# Patient Record
Sex: Male | Born: 1965 | Race: White | Hispanic: No | Marital: Married | Smoking: Never smoker
Health system: Southern US, Community
[De-identification: ages and names within clinical notes are randomized; demographics above are authoritative.]

## PROBLEM LIST (undated history)

## (undated) DIAGNOSIS — I10 Essential (primary) hypertension: Secondary | ICD-10-CM

## (undated) DIAGNOSIS — M542 Cervicalgia: Secondary | ICD-10-CM

## (undated) DIAGNOSIS — R519 Headache, unspecified: Secondary | ICD-10-CM

## (undated) DIAGNOSIS — R51 Headache: Secondary | ICD-10-CM

## (undated) HISTORY — DX: Headache, unspecified: R51.9

## (undated) HISTORY — PX: FOREARM SURGERY: SHX651

## (undated) HISTORY — PX: KNEE SURGERY: SHX244

## (undated) HISTORY — DX: Cervicalgia: M54.2

## (undated) HISTORY — DX: Essential (primary) hypertension: I10

## (undated) HISTORY — DX: Headache: R51

## (undated) HISTORY — PX: VASECTOMY: SHX75

---

## 2003-02-12 ENCOUNTER — Emergency Department (HOSPITAL_COMMUNITY): Admission: EM | Admit: 2003-02-12 | Discharge: 2003-02-12 | Payer: Self-pay | Admitting: Internal Medicine

## 2010-12-12 ENCOUNTER — Encounter: Payer: Self-pay | Admitting: Internal Medicine

## 2010-12-31 DIAGNOSIS — J984 Other disorders of lung: Secondary | ICD-10-CM | POA: Insufficient documentation

## 2011-01-01 ENCOUNTER — Institutional Professional Consult (permissible substitution) (INDEPENDENT_AMBULATORY_CARE_PROVIDER_SITE_OTHER): Payer: BC Managed Care – PPO | Admitting: Internal Medicine

## 2011-01-01 ENCOUNTER — Ambulatory Visit (INDEPENDENT_AMBULATORY_CARE_PROVIDER_SITE_OTHER)
Admission: RE | Admit: 2011-01-01 | Discharge: 2011-01-01 | Disposition: A | Discharge status: Home / Self Care | Payer: BC Managed Care – PPO | Source: Ambulatory Visit | Attending: Internal Medicine | Admitting: Internal Medicine

## 2011-01-01 ENCOUNTER — Other Ambulatory Visit: Payer: Self-pay | Admitting: Internal Medicine

## 2011-01-01 ENCOUNTER — Encounter: Payer: Self-pay | Admitting: Internal Medicine

## 2011-01-01 DIAGNOSIS — J984 Other disorders of lung: Secondary | ICD-10-CM

## 2011-01-01 DIAGNOSIS — I10 Essential (primary) hypertension: Secondary | ICD-10-CM | POA: Insufficient documentation

## 2011-01-02 ENCOUNTER — Encounter: Payer: Self-pay | Admitting: Internal Medicine

## 2011-01-07 ENCOUNTER — Telehealth: Payer: Self-pay | Admitting: Internal Medicine

## 2011-01-08 NOTE — Letter (Signed)
Summary: [facility]  [address]   [place], [place] [address]   Phone: [phone]  Fax: [phone]    01/02/2011  Lawrence Hines [address] [place], [place]  [address]  [place]  Dear Mr. ARNEY,   Please contact our office at [phone] to obatin recent test results. Thank You        Sincerely,   [facility]

## 2011-01-08 NOTE — Assessment & Plan Note (Signed)
Summary: Pulmonary Consultation/ eva RML nodule   Visit Type:  Initial Consult Primary Provider/Referring Provider:  Doreen Beam, MD  CC:  Pulmonary nodule.  History of Present Illness: 34 yowm never smoked but grew up in house full of smoke and now concerned about incidental RML by ct abd incidental finding for eval of flank pain, resolved   January 01, 2011   1st pulmonary office eval ov for eval of 3 mm 12/12/10 no cough or sob. Pt denies any significant sore throat, dysphagia, itching, sneezing,  nasal congestion or excess secretions,  fever, chills, sweats, unintended wt loss, pleuritic or exertional cp, hempoptysis, change in activity tolerance  orthopnea pnd or leg swelling.  No h/o RA or exp to histo  Preventive Screening-Counseling & Management  Alcohol-Tobacco     Passive Smoke Exposure: yes  Current Medications (verified): 1)  Allegra-D Allergy & Congestion 60-120 Mg Xr12h-Tab (Fexofenadine-Pseudoephedrine) .... Take 1 Tablet By Mouth Two Times A Day As Needed 2)  Benicar Hct 20-12.5 Mg Tabs (Olmesartan Medoxomil-Hctz) .... Take 1 Tablet By Mouth Once A Day  Allergies (verified): No Known Drug Allergies  Past History:  Past Medical History: CT ABD 3mm RML nodule 12/12/10  Past Surgical History: Left knee arthroscopy Left arm surgery x 3 Rt knee arthroscopy x 2  Family History: Lung CA- Father (was a smoker)  Social History: Married Children Never smoker Positive history of passive tobacco smoke exposure.  Naval architect Works for [employer] Exposure:  yes  Review of Systems  The patient denies shortness of breath with activity, shortness of breath at rest, productive cough, non-productive cough, coughing up blood, chest pain, irregular heartbeats, acid heartburn, indigestion, loss of appetite, weight change, abdominal pain, difficulty swallowing, sore throat, tooth/dental problems, headaches, nasal congestion/difficulty breathing through nose,  sneezing, itching, ear ache, anxiety, depression, hand/feet swelling, joint stiffness or pain, rash, change in color of mucus, and fever.    Vital Signs:  Patient profile:   45 year old male Height:      70 inches Weight:      221 pounds BMI:     31.82 O2 Sat:      97 % on Room air Temp:     98.0 degrees F oral Pulse rate:   79 / minute BP sitting:   122 / 76  (left arm)  Vitals Entered By: Vernie Murders (January 01, 2011 10:04 AM)  O2 Flow:  Room air  Physical Exam  Additional Exam:  wt 221 January 01, 2011  anxious wm nad HEENT: nl dentition, turbinates, and orophanx. Nl external ear canals without cough reflex NECK :  without JVD/Nodes/TM/ nl carotid upstrokes bilaterally LUNGS: no acc muscle use, clear to A and P bilaterally without cough on insp or exp maneuvers CV:  RRR  no s3 or murmur or increase in P2, no edema  ABD:  soft and nontender with nl excursion in the supine position. No bruits or organomegaly, bowel sounds nl MS:  warm without deformities, calf tenderness, cyanosis or clubbing SKIN: warm and dry without lesions   NEURO:  alert, approp, no deficits     CXR  Procedure date:  01/01/2011  Findings:      Comparison: None.  Findings: Trachea is midline.  Heart size normal.  Lungs are somewhat low in volume but clear.  No pleural fluid.  IMPRESSION: No acute findings.  Impression & Recommendations:  Problem # 1:  LUNG NODULE (ICD-518.89) only 3 mm by ct  and no macroscopic changes on cxr of concern.  He is relatively low risk and at 3 mm the only decision to be made is how long to wait before the next CT.  The only risk factors are passive smoke exp and pos fm hx.  Discussed in detail all the  indications, usual  risks and alternatives  relative to the benefits with patient who agrees to proceed with CT chest at 6 months or 06/12/2011  Other Orders: T-2 View CXR ([id number]) Consultation Level IV ([address]) Consultation Level IV (60454)  Patient  Instructions: 1)  Full CT of Chest to be done in August of 2012 to follow up the lung nodule which is way too small now to attempt a biopsy or sugery.

## 2011-01-15 NOTE — Progress Notes (Signed)
Summary: Test results  Phone Note Call from Patient Call back at [phone]   Caller: Patient Call For: Dr Sherene Sires Reason for Call: Lab or Test Results Summary of Call: Patient wants results of chest x-ray.  At work and may not be able to answer phone.  Has called twice today.  Please call back until patient answers. Initial call taken by: Leonette Monarch,  January 07, 2011 10:40 AM  Follow-up for Phone Call        Pt aware cxr results normal and to continue with recs.Carron Curie CMA  January 07, 2011 10:47 AM

## 2014-12-29 ENCOUNTER — Ambulatory Visit (HOSPITAL_COMMUNITY): Payer: Self-pay | Admitting: Psychology

## 2015-12-19 ENCOUNTER — Encounter: Payer: Self-pay | Admitting: Neurology

## 2015-12-19 ENCOUNTER — Ambulatory Visit (INDEPENDENT_AMBULATORY_CARE_PROVIDER_SITE_OTHER): Payer: BLUE CROSS/BLUE SHIELD | Admitting: Neurology

## 2015-12-19 VITALS — BP 128/78 | HR 88 | Ht 70.0 in | Wt 223.0 lb

## 2015-12-19 DIAGNOSIS — M542 Cervicalgia: Secondary | ICD-10-CM | POA: Diagnosis not present

## 2015-12-19 NOTE — Progress Notes (Signed)
PATIENT: Lawrence Hines DOB: Sep 03, 1966  Chief Complaint  Patient presents with  . Neck Pain    He is here with his wife, Georganna Skeans.  Reports having neck pain radiating into his left shoulder.  He has had a recent abnormal MRI and would like to further review the results.  His pain is causing him to have frequent headaches.  He has just completed a Prednisone dose pack without any improvement in symptoms.  He has also seen a chiropractor and felt it caused the pain to be worse.     HISTORICAL  Lawrence Hines is a 50 years old right-handed male, accompanied by his wife, seen in refer by his primary care physician Dr. 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 In Nov 2016, without clear triggers, he noticed gradual onset left-sided neck pain, radiating to his left shoulder, gradually build up, like a constant toothache, 5 out of 10 at baseline, sometimes it would exacerbated to 8 out of 10, over the past few months, he has tried ibuprofen 800 mg, Flexeril, hydrocodone as needed, with some improvement, he had a history of compounding fracture at his left arm due to baseball injury many years ago, he has occasionally numbness tingling of left lateral shoulder, denies significant weakness, no persistent sensory loss,  He denies right arm involvement, no gait difficulty, no bowel and bladder incontinence,  He also described frequent headaches, starting from neck, spreading to bilateral temporal region, constant pressure, 5/10, he has light noise sensitivity,   he denies nausea, he has high-pitched tinnitus, no hearing loss, no visual loss.   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 amLODipine (NORVASC) 5 MG tablet Take 5 mg by mouth daily.  0  . cyclobenzaprine (FLEXERIL) 10 MG tablet Take 10 mg by mouth 3 (three) times daily as needed.  1  . HYDROcodone-acetaminophen (NORCO) 7.5-325 MG tablet Take 1 tablet by mouth every 6 (six) hours as needed for moderate pain.    Marland Kitchen ibuprofen (ADVIL,MOTRIN) 800 MG tablet TAKE ONE TABLET BY MOUTH EVERY 8 HOURS AS NEEDED FOR NECK PAIN. TAKE WITH FOOD.  1  . olmesartan-hydrochlorothiazide (BENICAR HCT) 20-12.5 MG tablet Take 1 tablet by mouth daily.     No current facility-administered medications for this visit.    PAST MEDICAL HISTORY: Past Medical History  Diagnosis Date  . Hypertension   . Headache   . Neck pain     PAST SURGICAL HISTORY: Past Surgical History  Procedure Laterality Date  . Forearm surgery Left     x 3  . Knee surgery Bilateral     Left x 2, Right x 1  . Vasectomy      FAMILY HISTORY: Family History  Problem Relation Age of Onset  . Lung cancer Father   . Hypertension Father   .  Hyperlipidemia Father   . Diabetes Father   . Healthy Mother     SOCIAL HISTORY:  Social History   Social History  . Marital Status: Married    Spouse Name: N/A  . Number of Children: 1  . Years of Education: 13   Occupational History  . Manufacturer    Social History Main Topics  . Smoking status: Never Smoker   . Smokeless tobacco: Current User    Types: Chew     Comment: 1 can of chew per week  . Alcohol Use: 0.0 oz/week    0 Standard drinks or equivalent per week     Comment: Social use only    . Drug Use: No  . Sexual Activity: Not on file   Other Topics Concern  . Not on file   Social History Narrative   Lives at home with wife and son.   Right-handed.   No caffeine use.     PHYSICAL EXAM   Filed Vitals:   12/19/15 1121  BP: 128/78  Pulse: 88  Height:  (1.778 m)  Weight: 223 lb (101.152 kg)    Not recorded      Body mass index is 32 kg/(m^2).  PHYSICAL EXAMNIATION:  Gen: NAD, conversant, well nourised, obese, well groomed                     Cardiovascular: Regular rate rhythm, no peripheral edema, warm, nontender. Eyes: Conjunctivae clear without exudates or hemorrhage Neck: Supple, no carotid bruise. Pulmonary: Clear to auscultation bilaterally  Musculoskeletal: Tenderness of left upper trapezius, bilateral nuchal area, left worse than right, upon deep palpitation   NEUROLOGICAL EXAM:  MENTAL STATUS: Speech:    Speech is normal; fluent and spontaneous with normal comprehension.  Cognition:     Orientation to time, place and person     Normal recent and remote memory     Normal Attention span and concentration     Normal Language, naming, repeating,spontaneous speech     Fund of knowledge   CRANIAL NERVES: CN II: Visual fields are full to confrontation. Fundoscopic exam is normal with sharp discs and no vascular changes. Pupils are round equal and briskly reactive to light. CN III, IV, VI: extraocular movement are normal. No ptosis. CN V: Facial sensation is intact to pinprick in all 3 divisions bilaterally. Corneal responses are intact.  CN VII: Face is symmetric with normal eye closure and smile. CN VIII: Hearing is normal to rubbing fingers CN IX, X: Palate elevates symmetrically. Phonation is normal. CN XI: Head turning and shoulder shrug are intact CN XII: Tongue is midline with normal movements and no atrophy.  MOTOR: There is no pronator drift of out-stretched arms. Muscle bulk and tone are normal. Muscle strength is  normal.Well-healed left arm compounding fracture  REFLEXES: Reflexes are  3  and symmetric at the biceps, triceps, knees, and ankles. Plantar responses are flexor.  SENSORY: Intact to light touch, pinprick, position sense, and vibration sense are intact in fingers and toes.  COORDINATION: Rapid alternating movements and fine finger movements are intact. There is no dysmetria on finger-to-nose and heel-knee-shin.    GAIT/STANCE: Posture is normal. Gait is steady with normal steps, base, arm swing, and turning. Heel and toe walking are normal. Tandem gait is normal.  Romberg is absent.   DIAGNOSTIC DATA (LABS, IMAGING, TESTING) - I reviewed patient records, labs, notes, testing and imaging myself where available.   ASSESSMENT AND PLAN  Reynolds Kittel  Mangel is a 50 y.o. male   Left-sided neck pain, radiating pain to left shoulder, tenderness upon deep palpitation at left upper trapezia, bilateral nuchal muscles, left worse than right, normal neurological examination, well preserved deep tendon reflexes  Most consistent with musculoskeletal etiology  I have suggested neck stretching exercise, heating pad, continue when necessary NSAIDs  May consider physical therapy, chiropractor     Levert Feinstein, M.D. Ph.D.  [facility] [address], [address] [place], [place] [address] Ph: [phone] Fax: [phone]  ON:GEXBM Armanda Heritage, MD

## 2016-01-10 ENCOUNTER — Ambulatory Visit: Payer: BLUE CROSS/BLUE SHIELD | Admitting: Neurology

## 2019-07-22 ENCOUNTER — Other Ambulatory Visit: Payer: Self-pay

## 2019-07-22 DIAGNOSIS — Z20822 Contact with and (suspected) exposure to covid-19: Secondary | ICD-10-CM

## 2019-07-23 LAB — NOVEL CORONAVIRUS, NAA: SARS-CoV-2, NAA: DETECTED — AB

## 2021-03-20 ENCOUNTER — Encounter (INDEPENDENT_AMBULATORY_CARE_PROVIDER_SITE_OTHER): Payer: Self-pay | Admitting: *Deleted

## 2021-04-23 ENCOUNTER — Ambulatory Visit (INDEPENDENT_AMBULATORY_CARE_PROVIDER_SITE_OTHER): Payer: BC Managed Care – PPO | Admitting: Gastroenterology

## 2021-04-23 ENCOUNTER — Encounter (INDEPENDENT_AMBULATORY_CARE_PROVIDER_SITE_OTHER): Payer: Self-pay | Admitting: Gastroenterology

## 2021-04-23 ENCOUNTER — Other Ambulatory Visit: Payer: Self-pay

## 2021-04-23 DIAGNOSIS — K602 Anal fissure, unspecified: Secondary | ICD-10-CM

## 2021-04-23 DIAGNOSIS — K644 Residual hemorrhoidal skin tags: Secondary | ICD-10-CM | POA: Diagnosis not present

## 2021-04-23 MED ORDER — CLOTRIMAZOLE 1 % EX CREA: 1.0000 "application " | TOPICAL_CREAM | Freq: Two times a day (BID) | CUTANEOUS | 0 refills | Status: DC

## 2021-04-23 MED ORDER — DILTIAZEM GEL 2 %: 1.0000 "application " | Freq: Two times a day (BID) | CUTANEOUS | 2 refills | Status: DC

## 2021-04-23 MED ORDER — LIDOCAINE 2 % EX GEL: 1.0000 "application " | CUTANEOUS | 1 refills | Status: DC | PRN

## 2021-04-23 NOTE — Progress Notes (Signed)
Lawrence Hines, M.D. Gastroenterology & Hepatology [facility] [address] [place], [place] [address] Primary Care Physician: Ignatius Specking, MD [address] [place] [address]  Referring MD: PCP  Chief Complaint: Severe rectal pain  History of Present Illness: Lawrence Hines is a 55 y.o. male with past medical history of hypertension and hemorrhoids status post hemorrhoidectomy who presents for evaluation of severe rectal pain.  The patient reports that he has presented recurrent episodes of pain in the anal area since January 2021. He states that he never had pain in this area in the past.  Patient states that he had frequent episodes of rectal pain, for which he sought help with [facility]  He was evaluated by Dr.  Marcha Solders who performed a colonoscopy with hemorrhoid banding x4 in February 2021.  He states that at that time he had some rectal bleeding which slightly improved with the banding but he was having persistent pain.  Due to this he underwent a subsequent hemorrhoidectomy for grade 2 hemorrhoids on 05/2020.  The patient states that after having the surgery the pain became more intense and frequent.  In fact he has been affecting his work and he has not been able to sit for prolonged periods of time.  Has used preparation H in the past a few times without significant relief.   He reports having persistent pain in the rectal area but he has noticed that when he sits the pain is worst. He feels that the pain "is in the left side of the rectum". He reports that he has worsening pain after he has a bowel movement.He denies having any melena or hematochezia. He is currently taking docusate on Mondays and Fridays.  He has at least 1 soft BM every day. He is currently doing sitz baths x2 per day.  The patient denies having any nausea, vomiting, fever, chills, hematochezia, melena, hematemesis, abdominal distention, abdominal pain,  diarrhea, jaundice, pruritus or weight loss.  Last Colonoscopy:11/2019 - tubular adenoma removed from [facility] (unknown size) and hemorrhoid banding. Advised to repeat in 5 years.  FHx: neg for any gastrointestinal/liver disease, no malignancies Social: neg smoking, alcohol or illicit drug use Surgical: no abdominal surgeries  Past Medical History: Past Medical History:  Diagnosis Date   Headache    Hypertension    Neck pain     Past Surgical History: Past Surgical History:  Procedure Laterality Date   FOREARM SURGERY Left    x 3   KNEE SURGERY Bilateral    Left x 2, Right x 1   VASECTOMY      Family History: Family History  Problem Relation Age of Onset   Lung cancer Father    Hypertension Father    Hyperlipidemia Father    Diabetes Father    Healthy Mother     Social History: Social History   Tobacco Use  Smoking Status Never  Smokeless Tobacco Current   Types: Chew  Tobacco Comments   1 can of chew per week   Social History   Substance and Sexual Activity  Alcohol Use Yes   Alcohol/week: 0.0 standard drinks   Comment: Social use only   Social History   Substance and Sexual Activity  Drug Use No    Allergies: No Known Allergies  Medications: Current Outpatient Medications  Medication Sig Dispense Refill   amLODipine (NORVASC) 5 MG tablet Take 5 mg by mouth daily.  0   clonazePAM (KLONOPIN) 0.5 MG  tablet Take 0.5 mg by mouth at bedtime. As needed.     docusate sodium (COLACE) 100 MG capsule Take 100 mg by mouth daily. On Mondays and Fridays per patient.     hydrochlorothiazide (HYDRODIURIL) 12.5 MG tablet Take 12.5 mg by mouth daily.     olmesartan (BENICAR) 40 MG tablet Take 40 mg by mouth daily.     tadalafil (CIALIS) 5 MG tablet Take 5 mg by mouth daily as needed for erectile dysfunction.     No current facility-administered medications for this visit.    Review of Systems: GENERAL: negative for malaise, night sweats HEENT: No changes in  hearing or vision, no nose bleeds or other nasal problems. NECK: Negative for lumps, goiter, pain and significant neck swelling RESPIRATORY: Negative for cough, wheezing CARDIOVASCULAR: Negative for chest pain, leg swelling, palpitations, orthopnea GI: SEE HPI MUSCULOSKELETAL: Negative for joint pain or swelling, back pain, and muscle pain. SKIN: Negative for lesions, rash PSYCH: Negative for sleep disturbance, mood disorder and recent psychosocial stressors. HEMATOLOGY Negative for prolonged bleeding, bruising easily, and swollen nodes. ENDOCRINE: Negative for cold or heat intolerance, polyuria, polydipsia and goiter. NEURO: negative for tremor, gait imbalance, syncope and seizures. The remainder of the review of systems is noncontributory.   Physical Exam: BP (!) 133/97 (BP Location: Right Arm, Patient Position: Sitting, Cuff Size: Large)   Pulse 64   Temp 98.2 F (36.8 C) (Oral)   Ht 5\' 10"  (1.778 m)   Wt 228 lb (103.4 kg)   BMI 32.71 kg/m  GENERAL: The patient is AO x3, in no acute distress. HEENT: Head is normocephalic and atraumatic. EOMI are intact. Mouth is well hydrated and without lesions. NECK: Supple. No masses LUNGS: Clear to auscultation. No presence of rhonchi/wheezing/rales. Adequate chest expansion HEART: RRR, normal s1 and s2. ABDOMEN: Soft, nontender, no guarding, no peritoneal signs, and nondistended. BS +. No masses. RECTAL EXAM: there is presence of a 1 cm anal fissure at  6 position -very tender to gentle touch, with presence of grade 1 non thrombosed external hemorrhoids, no presence of internal masses upon DRE, normal anal tone. Chaperone: Lovelace EXTREMITIES: Without any cyanosis, clubbing, rash, lesions or edema. NEUROLOGIC: AOx3, no focal motor deficit. SKIN: no jaundice, no rashes   Imaging/Labs: as above  I personally reviewed and interpreted the available labs, imaging and endoscopic files.  Impression and Plan: TAEVEON KEESLING is a  55 y.o. male with past medical history of hypertension and hemorrhoids status post hemorrhoidectomy who presents for evaluation of severe rectal pain.  Had history of hemorrhoids that have been treated with banding and hemorrhoidectomy without significant relief of his pain.  The patient has areas of an anal fissure which could cause the severe pain he is currently presenting.  He has some associated external hemorrhoids but these are not indurated or thrombosed.  For now, we will focus on management of the anal fissure with a combination of topical antimycotic and diltiazem for 6 to 8 weeks.  To alleviate his symptoms he can use some lidocaine gel and sitz baths in between.  I also advised him that he has to take MiraLAX to soften his bowel movements even more to avoid any significant straining.  Patient understood and agreed.  - Start diltiazem cream in anal area - use twice a day - Start clotrimazole cream in anal area - Can use topical lidocaine to numb anal area - Can do sitz baths to improve abdominal pain - Start taking Miralax  1 capful every day tow soften stool - RTC 2 months  All questions were answered.      Lawrence Blazing, MD Gastroenterology and Hepatology [facility]

## 2021-04-23 NOTE — Patient Instructions (Signed)
Start diltiazem cream in anal area - use twice a day Start clotrimazole cream in anal area Can use topical lidocaine to numb anal area Can do sitz baths to improve abdominal pain Start taking Miralax 1 capful every day tow soften stool

## 2021-06-26 ENCOUNTER — Other Ambulatory Visit (INDEPENDENT_AMBULATORY_CARE_PROVIDER_SITE_OTHER): Payer: Self-pay | Admitting: Gastroenterology

## 2021-06-26 ENCOUNTER — Ambulatory Visit (INDEPENDENT_AMBULATORY_CARE_PROVIDER_SITE_OTHER): Payer: BC Managed Care – PPO | Admitting: Gastroenterology

## 2021-06-26 ENCOUNTER — Other Ambulatory Visit: Payer: Self-pay

## 2021-06-26 ENCOUNTER — Encounter (INDEPENDENT_AMBULATORY_CARE_PROVIDER_SITE_OTHER): Payer: Self-pay | Admitting: Gastroenterology

## 2021-06-26 VITALS — BP 137/83 | HR 73 | Temp 98.2°F | Ht 70.0 in | Wt 226.2 lb

## 2021-06-26 DIAGNOSIS — K6289 Other specified diseases of anus and rectum: Secondary | ICD-10-CM | POA: Insufficient documentation

## 2021-06-26 DIAGNOSIS — K602 Anal fissure, unspecified: Secondary | ICD-10-CM

## 2021-06-26 MED ORDER — DILTIAZEM GEL 2 %: 1.0000 "application " | Freq: Two times a day (BID) | CUTANEOUS | 2 refills | Status: DC

## 2021-06-26 MED ORDER — CLOTRIMAZOLE 1 % EX CREA: 1.0000 "application " | TOPICAL_CREAM | Freq: Two times a day (BID) | CUTANEOUS | 0 refills | Status: DC

## 2021-06-26 MED ORDER — LIDOCAINE 2 % EX GEL: 1.0000 "application " | CUTANEOUS | 1 refills | Status: AC | PRN

## 2021-06-26 NOTE — Patient Instructions (Addendum)
Continue using diltiazem cream, clotrimazole and lidocaine to help with rectal pain. We have refilled all of these for you Continue miralax and high fiber diet to keep stools very soft/loose. We will send referral to surgeon for further evaluation of your hemorrhoids.

## 2021-06-26 NOTE — Progress Notes (Signed)
Referring Provider: Ignatius Specking, MD Primary Care Physician:  Ignatius Specking, MD Primary GI Physician: Levon Hedger  Chief Complaint  Patient presents with   Follow-up    Patient states he is no better. He has some issues with hemorrhoids, he says the are very painful. This is interfering with his job. He has not seen any blood in stools. He has soft stools he eats high fiber diet, drinks plenty of water. Uses a sitz bath daily has to sit on a ice bottle for relief at work. He has a good appetite.    HPI:   Lawrence Hines is a 55 y.o. male with past medical history of hypertension, hemorrhoids s/p hemorrhoidectomy in August 2021. Last seen in clinic in June 2022 for severe rectal pain.   Patient presenting today for follow up of rectal pain, states it has been ongoing since hemorrhoidectomy at the end of last year.  Source of rectal pain at last clinic visit in June 2022 was suspected secondary to anal fissure as hemorrhoids present were small and not thrombosed. He was prescribed diltiazem gel and clotrimazole cream as well as topical lidocaine with instructions to do sitz bath and 1 capful of miralax daily to help with pain. He reports he has been using all prescribed medications for anal fissure as well as taking miralax daily, eating diet high in fiber, and doing multiple sitz baths per day without any relief. He denies any bleeding or itching and describes pain as severe burning, especially worse after BMs. Reports atleast one good BM per day that is very soft/loose. He is having to sit on a water bottle at work and has also had to miss a few days of work due to the pain he is experiencing.   Last Colonoscopy:(11/2019) tubular adenoma removed from [facility] (size unknown) Last Endoscopy:  Recommendations:  Repeat surveillance colonoscopy in 2026  Past Medical History:  Diagnosis Date   Headache    Hypertension    Neck pain     Past Surgical History:  Procedure Laterality Date   FOREARM  SURGERY Left    x 3   KNEE SURGERY Bilateral    Left x 2, Right x 1   VASECTOMY      Current Outpatient Medications  Medication Sig Dispense Refill   amLODipine (NORVASC) 5 MG tablet Take 5 mg by mouth daily.  0   clonazePAM (KLONOPIN) 0.5 MG tablet Take 0.5 mg by mouth at bedtime. As needed.     clotrimazole (LOTRIMIN) 1 % cream Apply 1 application topically 2 (two) times daily. 30 g 0   hydrochlorothiazide (HYDRODIURIL) 12.5 MG tablet Take 12.5 mg by mouth daily.     Lidocaine 2 % GEL Apply 1 application topically as needed. Apply in anal area 28.33 g 1   olmesartan (BENICAR) 40 MG tablet Take 40 mg by mouth daily.     polyethylene glycol (MIRALAX / GLYCOLAX) 17 g packet Take 17 g by mouth daily.     tadalafil (CIALIS) 5 MG tablet Take 5 mg by mouth daily as needed for erectile dysfunction.     diltiazem 2 % GEL Apply 1 application topically 2 (two) times daily. (Patient not taking: Reported on 06/26/2021) 30 g 2   docusate sodium (COLACE) 100 MG capsule Take 100 mg by mouth daily. On Mondays and Fridays per patient. (Patient not taking: Reported on 06/26/2021)     No current facility-administered medications for this visit.    Allergies as of 06/26/2021   (  No Known Allergies)    Family History  Problem Relation Age of Onset   Lung cancer Father    Hypertension Father    Hyperlipidemia Father    Diabetes Father    Healthy Mother     Social History   Socioeconomic History   Marital status: Married    Spouse name: Not on file   Number of children: 1   Years of education: 13   Highest education level: Not on file  Occupational History   Occupation: Child psychotherapist  Tobacco Use   Smoking status: Never   Smokeless tobacco: Current    Types: Chew   Tobacco comments:    1 can of chew per week  Vaping Use   Vaping Use: Never used  Substance and Sexual Activity   Alcohol use: Yes    Alcohol/week: 0.0 standard drinks    Comment: Social use only   Drug use: No   Sexual  activity: Not on file  Other Topics Concern   Not on file  Social History Narrative   Lives at home with wife and son.   Right-handed.   No caffeine use.   Social Determinants of Health   Financial Resource Strain: Not on file  Food Insecurity: Not on file  Transportation Needs: Not on file  Physical Activity: Not on file  Stress: Not on file  Social Connections: Not on file    Review of Systems: Gen: Denies fever, chills, anorexia. Denies fatigue, weakness, weight loss.  CV: Denies chest pain, palpitations, syncope, peripheral edema, and claudication. Resp: Denies dyspnea at rest, cough, wheezing, coughing up blood, and pleurisy. GI: Denies vomiting blood, jaundice, and fecal incontinence. Denies dysphagia or odynophagia. +rectal pain +external hemorrhoid Derm: Denies rash, itching, dry skin Psych: Denies depression, anxiety, memory loss, confusion. No homicidal or suicidal ideation.  Heme: Denies bruising, bleeding, and enlarged lymph nodes.  Physical Exam: BP 137/83 (BP Location: Left Arm, Patient Position: Sitting, Cuff Size: Large)   Pulse 73   Temp 98.2 F (36.8 C) (Oral)   Ht 5\' 10"  (1.778 m)   Wt 226 lb 3.2 oz (102.6 kg)   BMI 32.46 kg/m  General:   Alert and oriented. No distress noted. Pleasant and cooperative.  Head:  Normocephalic and atraumatic. Eyes:  Conjuctiva clear without scleral icterus. Mouth:  Oral mucosa pink and moist. Good dentition. No lesions. Heart: Normal rate and rhythm, s1 and s2 heart sounds present.  Lungs: Clear lung sounds in all lobes. Respirations equal and unlabored. Abdomen:  +BS, soft, non-tender and non-distended. No rebound or guarding. No HSM or masses noted. Rectal: large external hemorrhoid, left lateral, small internal hemorrhoid noted at right posterior, very mild fissure, sphincter tone WNL *Dr. present as witness for rectal exam  Derm: No palmar erythema or jaundice Msk:  Symmetrical without gross deformities.  Normal posture. Extremities:  Without edema. Neurologic:  Alert and  oriented x4 Psych:  Alert and cooperative. Normal mood and affect.  Invalid input(s): 6 MONTHS   ASSESSMENT: Lawrence Hines is a 55 y.o. male presenting today with ongoing rectal pain related to hemorrhoids/anal fissure.  Patient has followed recommendations from previous clinic visit in June very closely, using diltiazem gel, clotrimazole cream and lidocaine gel, as well as doing miralax and high fiber diet to ensure very soft/loose stools, however, he has had no improvement in his rectal pain. Pain is so bad it is affect him be able to work, as he has to sit on an ice bottle  all day to get any relief. Taking multiple sitz baths whenever able to help with pain. Denies itching or bleeding.   We will refer him to [facility] for further evaluation as he has a large external hemorrhoid and we have exhausted medicinal treatments. very small anal fissure that looks somewhat healed from previous clinic visit. Patient should continue current medication regimen as well as miralax and high fiber diet to keep stools very soft/loose.   *Dr. Levon Hedger present in room as witness for rectal exam  PLAN:  Continue diltiazem gel BID, clotramizole cream BID and lidocaine gel prn to help with rectal pain 2. Continue miralax and high fiber diet to keep stools very soft/loose 3. Continue sitz baths  4. Referral to [facility] for further eval of hemorrhoids  Follow Up: As needed  Peg Fifer L. Jeanmarie Hubert, MSN, APRN, AGNP-C Adult-Gerontology Nurse Practitioner [facility]

## 2021-07-03 ENCOUNTER — Ambulatory Visit (INDEPENDENT_AMBULATORY_CARE_PROVIDER_SITE_OTHER): Payer: BC Managed Care – PPO | Admitting: Gastroenterology

## 2021-07-30 ENCOUNTER — Ambulatory Visit (INDEPENDENT_AMBULATORY_CARE_PROVIDER_SITE_OTHER): Payer: Self-pay | Admitting: Gastroenterology

## 2022-01-06 NOTE — Progress Notes (Incomplete)
History of Present Illness: ***  Past Medical History:  Diagnosis Date   Headache    Hypertension    Neck pain     Past Surgical History:  Procedure Laterality Date   FOREARM SURGERY Left    x 3   KNEE SURGERY Bilateral    Left x 2, Right x 1   VASECTOMY      Home Medications:  Allergies as of 01/08/2022   No Known Allergies      Medication List        Accurate as of January 06, 2022  8:35 PM. If you have any questions, ask your nurse or doctor.          amLODipine 5 MG tablet Commonly known as: NORVASC Take 5 mg by mouth daily.   clonazePAM 0.5 MG tablet Commonly known as: KLONOPIN Take 0.5 mg by mouth at bedtime. As needed.   clotrimazole 1 % cream Commonly known as: LOTRIMIN Apply 1 application topically 2 (two) times daily.   diltiazem 2 % Gel Apply 1 application topically 2 (two) times daily.   hydrochlorothiazide 12.5 MG tablet Commonly known as: HYDRODIURIL Take 12.5 mg by mouth daily.   Lidocaine 2 % Gel Apply 1 application topically as needed. Apply in anal area   olmesartan 40 MG tablet Commonly known as: BENICAR Take 40 mg by mouth daily.   polyethylene glycol 17 g packet Commonly known as: MIRALAX / GLYCOLAX Take 17 g by mouth daily.   tadalafil 5 MG tablet Commonly known as: CIALIS Take 5 mg by mouth daily as needed for erectile dysfunction.        Allergies: No Known Allergies  Family History  Problem Relation Age of Onset   Lung cancer Father    Hypertension Father    Hyperlipidemia Father    Diabetes Father    Healthy Mother     Social History:  reports that he has never smoked. His smokeless tobacco use includes chew. He reports current alcohol use. He reports that he does not use drugs.  ROS: A complete review of systems was performed.  All systems are negative except for pertinent findings as noted.  Physical Exam:  Vital signs in last 24 hours: There were no vitals taken for this visit. Constitutional:  Alert  and oriented, No acute distress Cardiovascular: Regular rate  Respiratory: Normal respiratory effort GI: Abdomen is soft, nontender, nondistended, no abdominal masses. No CVAT.  Genitourinary: Normal male phallus, testes are descended bilaterally and non-tender and without masses, scrotum is normal in appearance without lesions or masses, perineum is normal on inspection. Lymphatic: No lymphadenopathy Neurologic: Grossly intact, no focal deficits Psychiatric: Normal mood and affect  I have reviewed prior pt notes  I have reviewed notes from referring/previous physicians  I have reviewed urinalysis results  I have independently reviewed prior imaging  I have reviewed prior PSA results  I have reviewed prior urine culture   Impression/Assessment:  ***  Plan:  ***

## 2022-01-08 ENCOUNTER — Encounter: Payer: Self-pay | Admitting: Urology

## 2022-01-08 ENCOUNTER — Other Ambulatory Visit: Payer: Self-pay

## 2022-01-08 ENCOUNTER — Ambulatory Visit: Payer: BC Managed Care – PPO | Admitting: Urology

## 2022-01-08 VITALS — BP 148/104 | HR 62 | Wt 216.0 lb

## 2022-01-08 DIAGNOSIS — N5089 Other specified disorders of the male genital organs: Secondary | ICD-10-CM

## 2022-01-08 LAB — MICROSCOPIC EXAMINATION
Bacteria, UA: NONE SEEN
Renal Epithel, UA: NONE SEEN /hpf
WBC, UA: NONE SEEN /hpf (ref 0–5)

## 2022-01-08 LAB — URINALYSIS, ROUTINE W REFLEX MICROSCOPIC
Bilirubin, UA: NEGATIVE
Glucose, UA: NEGATIVE
Ketones, UA: NEGATIVE
Leukocytes,UA: NEGATIVE
Nitrite, UA: NEGATIVE
Protein,UA: NEGATIVE
Specific Gravity, UA: 1.015 (ref 1.005–1.030)
Urobilinogen, Ur: 0.2 mg/dL (ref 0.2–1.0)
pH, UA: 6 (ref 5.0–7.5)

## 2022-01-29 ENCOUNTER — Ambulatory Visit: Payer: BC Managed Care – PPO | Admitting: Urology

## 2022-02-04 ENCOUNTER — Ambulatory Visit (HOSPITAL_COMMUNITY)
Admission: RE | Admit: 2022-02-04 | Discharge: 2022-02-04 | Disposition: A | Discharge status: Home / Self Care | Payer: BC Managed Care – PPO | Source: Ambulatory Visit | Attending: Urology | Admitting: Urology

## 2022-02-04 DIAGNOSIS — N5089 Other specified disorders of the male genital organs: Secondary | ICD-10-CM | POA: Insufficient documentation

## 2022-02-05 ENCOUNTER — Ambulatory Visit: Payer: BC Managed Care – PPO | Admitting: Urology

## 2022-02-18 NOTE — Progress Notes (Incomplete)
History of Present Illness:  ? ?3.14.2023: 56 year old male comes in today with his wife, Luanne for evaluation and management of right scrotal abnormality. ? ?About 10 days ago began having pain in his right testicle/scrotal area.  Fairly sudden on onset.  Worse with walking and touching the area.  No specific complaints of a lump in his scrotum, but he does have a tight scrotum necessitating a prior anesthetic vasa ectomy years ago. ? ?He presented to his primary care provider in [place] where he was put on Cipro and sent for scrotal ultrasound.  This revealed an extra prostatic abnormality associated with his right epididymis.  He also has a left hydrocele. ? ?He has gotten some better with the antibiotic management.  He still has 2 to 3 days of the Cipro left.  He denies any dysuria, gross hematuria.  He denies any abdominal pain.  He denies any history of scrotal trauma. ? ?Past Medical History:  ?Diagnosis Date  ? Headache   ? Hypertension   ? Neck pain   ? ? ?Past Surgical History:  ?Procedure Laterality Date  ? FOREARM SURGERY Left   ? x 3  ? KNEE SURGERY Bilateral   ? Left x 2, Right x 1  ? VASECTOMY    ? ? ?Home Medications:  ?Allergies as of 02/19/2022   ?No Known Allergies ?  ? ?  ?Medication List  ?  ? ?  ? Accurate as of February 18, 2022  8:40 PM. If you have any questions, ask your nurse or doctor.  ?  ?  ? ?  ? ?amLODipine 5 MG tablet ?Commonly known as: NORVASC ?Take 5 mg by mouth daily. ?  ?ciprofloxacin 500 MG tablet ?Commonly known as: CIPRO ?Take 500 mg by mouth 2 (two) times daily. ?  ?clonazePAM 0.5 MG tablet ?Commonly known as: KLONOPIN ?Take 0.5 mg by mouth at bedtime. As needed. ?  ?diltiazem 2 % Gel ?Apply 1 application topically 2 (two) times daily. ?  ?hydrochlorothiazide 12.5 MG tablet ?Commonly known as: HYDRODIURIL ?Take 12.5 mg by mouth daily. ?  ?Lidocaine 2 % Gel ?Apply 1 application topically as needed. Apply in anal area ?  ?olmesartan 40 MG tablet ?Commonly known as: BENICAR ?Take 40  mg by mouth daily. ?  ?tadalafil 5 MG tablet ?Commonly known as: CIALIS ?Take 5 mg by mouth daily as needed for erectile dysfunction. ?  ? ?  ? ? ?Allergies: No Known Allergies ? ?Family History  ?Problem Relation Age of Onset  ? Lung cancer Father   ? Hypertension Father   ? Hyperlipidemia Father   ? Diabetes Father   ? Healthy Mother   ? ? ?Social History:  reports that he has never smoked. His smokeless tobacco use includes chew. He reports current alcohol use. He reports that he does not use drugs. ? ?ROS: ?A complete review of systems was performed.  All systems are negative except for pertinent findings as noted. ? ?Physical Exam:  ?Vital signs in last 24 hours: ?There were no vitals taken for this visit. ?Constitutional:  Alert and oriented, No acute distress ?Cardiovascular: Regular rate  ?Respiratory: Normal respiratory effort ?GI: Abdomen is soft, nontender, nondistended, no abdominal masses. No CVAT.  ?Genitourinary: Normal male phallus, testes are descended bilaterally and non-tender and without masses, scrotum is normal in appearance without lesions or masses, perineum is normal on inspection. ?Lymphatic: No lymphadenopathy ?Neurologic: Grossly intact, no focal deficits ?Psychiatric: Normal mood and affect ? ?I have reviewed prior pt  notes ? ?I have reviewed notes from referring/previous physicians ? ?I have reviewed urinalysis results ? ?I have independently reviewed prior imaging ? ?I have reviewed prior PSA results ? ?I have reviewed prior urine culture ? ? ?Impression/Assessment:  ?*** ? ?Plan:  ?*** ? ?

## 2022-02-19 ENCOUNTER — Ambulatory Visit: Payer: BC Managed Care – PPO | Admitting: Urology

## 2022-02-19 DIAGNOSIS — N5089 Other specified disorders of the male genital organs: Secondary | ICD-10-CM

## 2023-02-22 IMAGING — US US SCROTUM W/ DOPPLER COMPLETE
1 series · 13 of 25 positions shown · non-contrast
Comparison: 01/03/2022

CLINICAL DATA: Scrotal mass, pain

EXAM:
SCROTAL ULTRASOUND
DOPPLER ULTRASOUND OF THE TESTICLES
TECHNIQUE: Complete ultrasound examination of the testicles, epididymis, and
other scrotal structures was performed. Color and spectral Doppler
ultrasound were also utilized to evaluate blood flow to the
testicles.

[Series 1: us scrotum w/doppler · 13 of 83 slices shown]
[im 1/83]
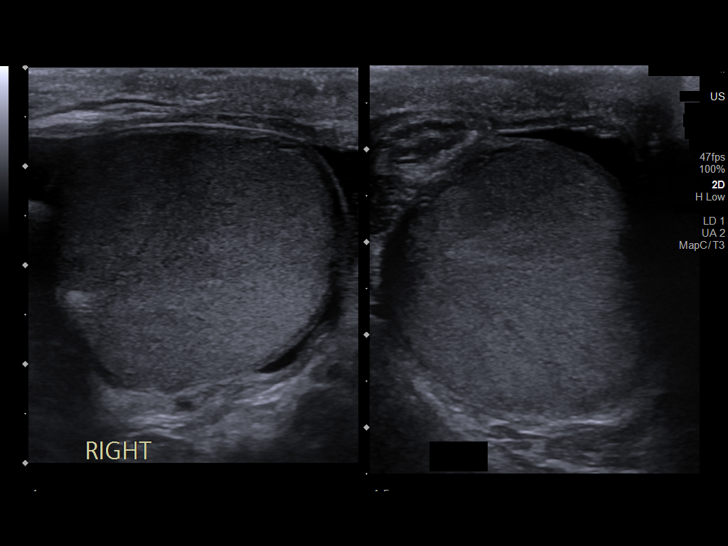
[im 7/83]
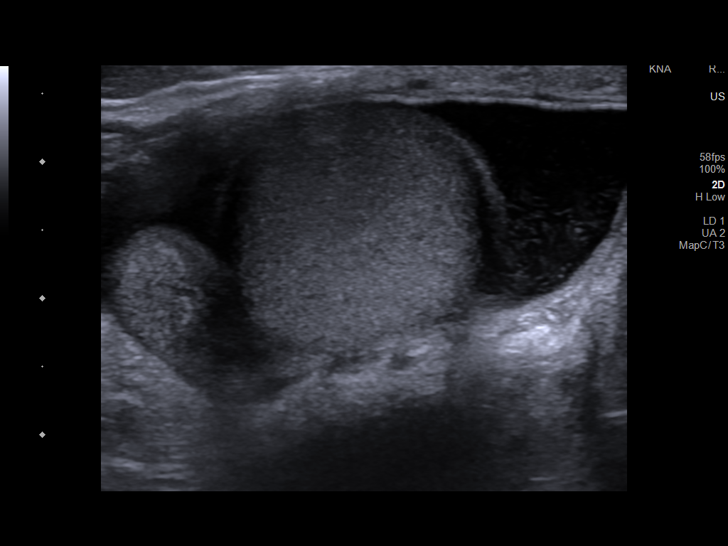
[im 14/83]
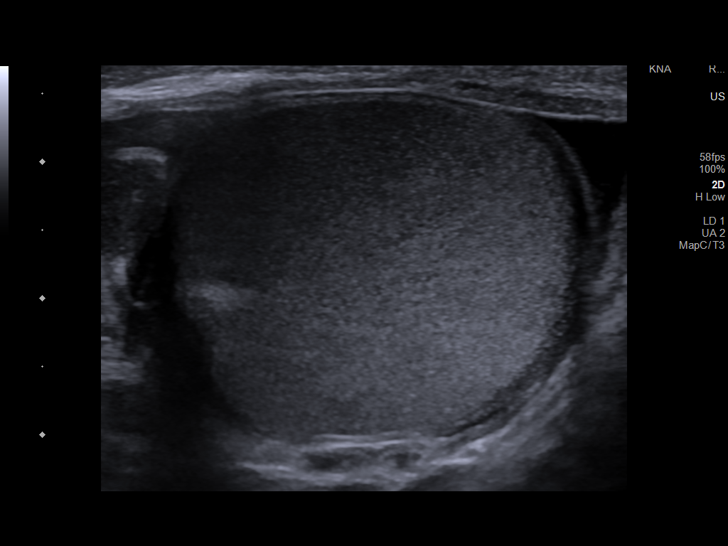
[im 21/83]
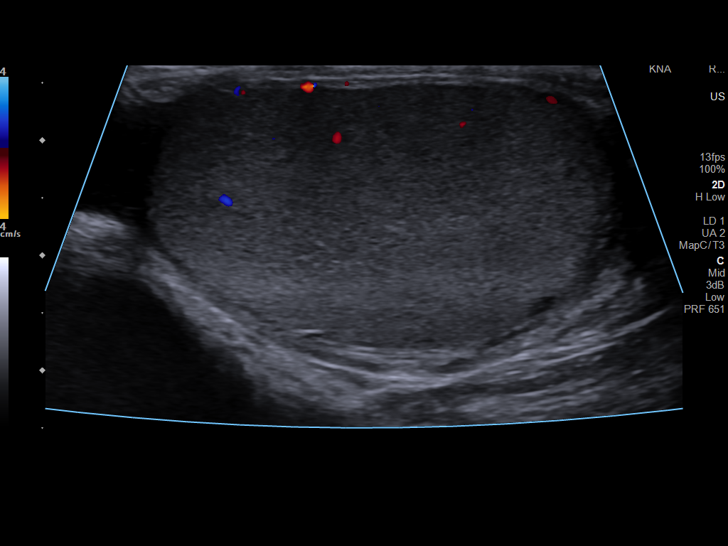
[im 28/83]
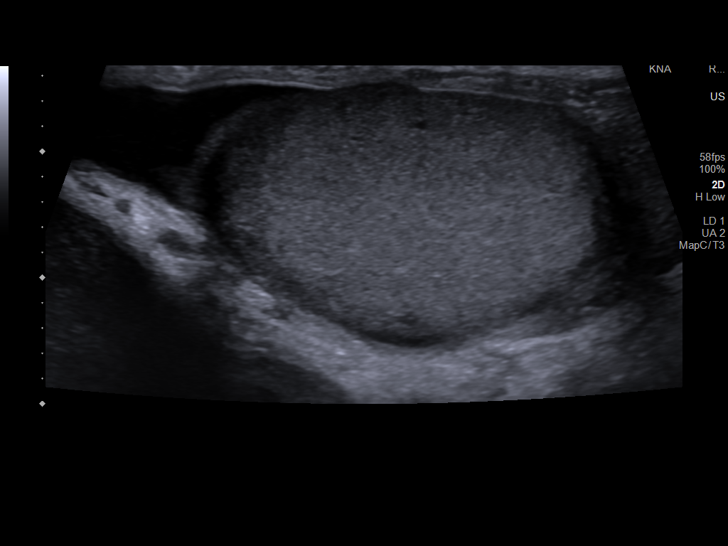
[im 35/83]
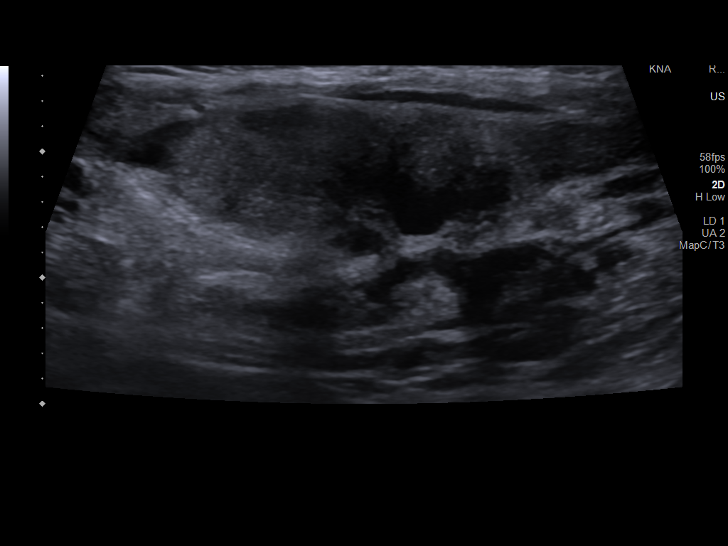
[im 42/83]
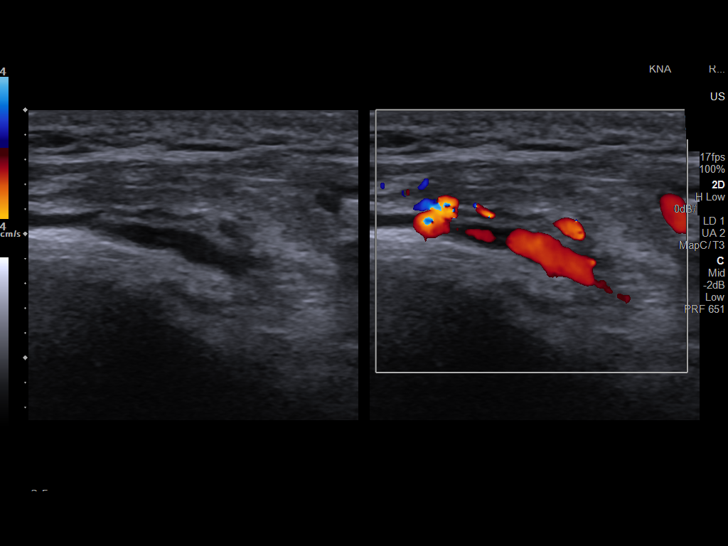
[im 48/83]
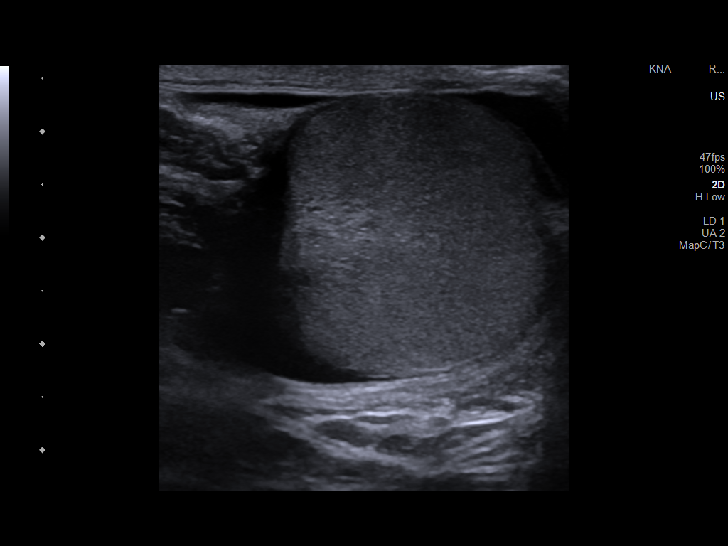
[im 55/83]
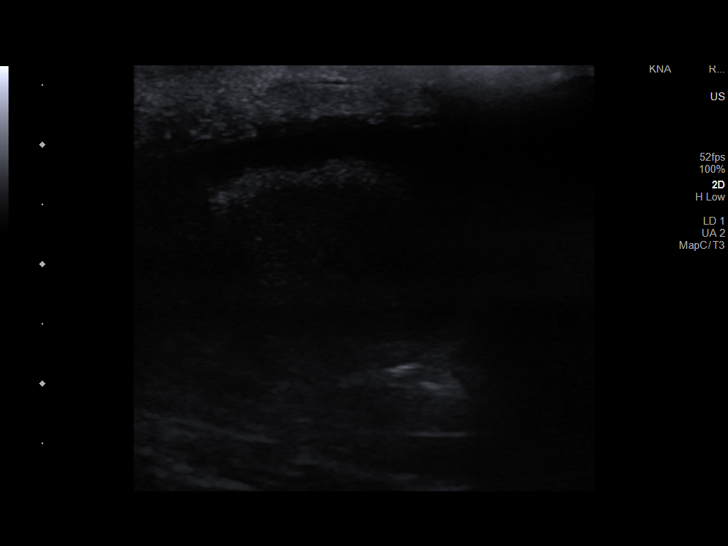
[im 62/83]
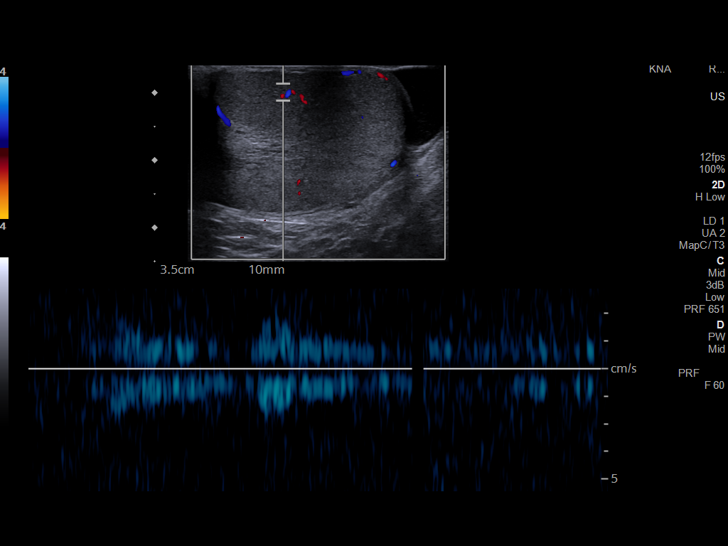
[im 69/83]
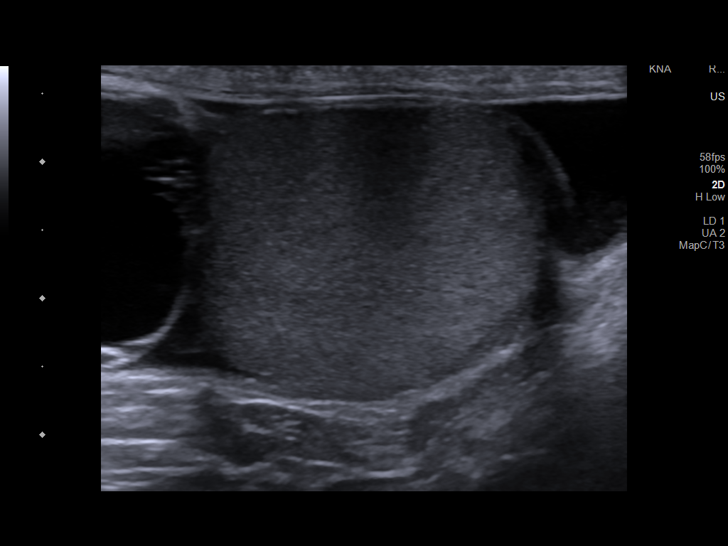
[im 76/83]
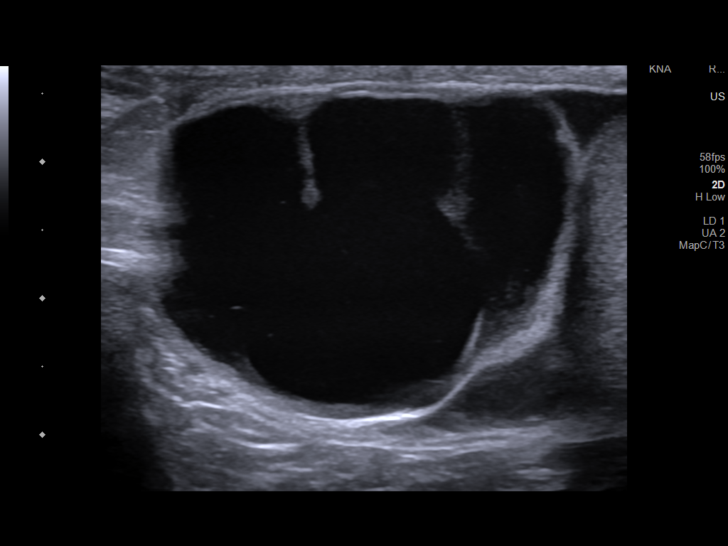
[im 83/83]
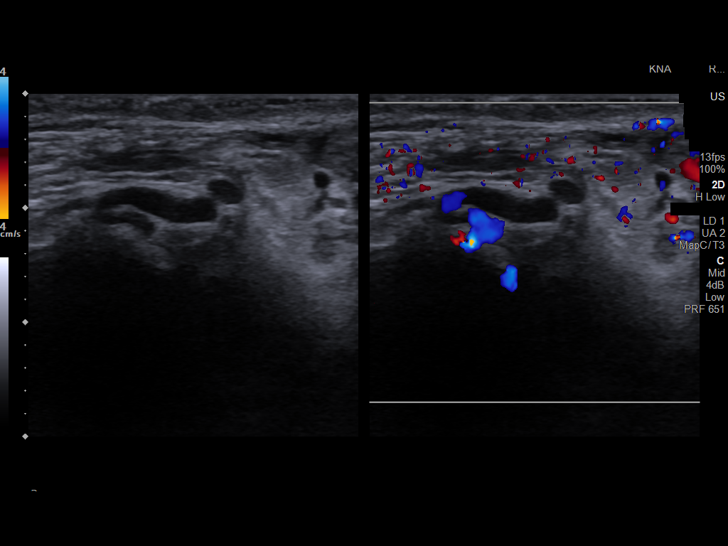

[13 of 25 positions shown; findings below may reference images not displayed]

FINDINGS: Right testicle

Measurements: 4.1 x 2.3 x 2.9 cm. No mass or microlithiasis
visualized.

Left testicle

Measurements: 2.8 x 2.3 x 2.9 cm. No mass or microlithiasis
visualized. There is 5 mm hyperechoic structure in the inner margin
of scrotal sac on the left side which has not changed significantly.
This may suggest a scrotolith.

Right epididymis: There is slightly inhomogeneous echogenicity.
Previously seen 1.4 x 1.2 cm complex structure in the right
epididymis is not visualized in the current study.

Left epididymis: There is 3.6 x 3.1 x 3 cm cystic structure with
thin internal septations which measured 4.2 cm in maximum diameter
in the previous study.

Hydrocele:  Small bilateral hydrocele is present

Varicocele:  None visualized.

Pulsed Doppler interrogation of both testes demonstrates normal low
resistance arterial and venous waveforms bilaterally.
IMPRESSION: There is homogeneous echogenicity in both testes. There is vascular
flow in both testes. Small bilateral hydrocele.

There is 3.6 x 3.1 x 3 cm cystic structure in the left epididymis
which measures slightly smaller in size. Complex echogenic structure
seen in the previous study in the right epididymis is not distinctly
seen in the current study.

## 2023-02-27 ENCOUNTER — Ambulatory Visit: Payer: BC Managed Care – PPO | Admitting: Neurology

## 2023-02-27 ENCOUNTER — Encounter: Payer: Self-pay | Admitting: Neurology

## 2023-02-27 VITALS — BP 131/81 | HR 100 | Ht 70.0 in | Wt 217.0 lb

## 2023-02-27 DIAGNOSIS — R292 Abnormal reflex: Secondary | ICD-10-CM | POA: Insufficient documentation

## 2023-02-27 DIAGNOSIS — R29898 Other symptoms and signs involving the musculoskeletal system: Secondary | ICD-10-CM | POA: Diagnosis not present

## 2023-02-27 NOTE — Progress Notes (Signed)
Chief Complaint  Patient presents with   New Patient (Initial Visit)    Rm 15, alone left hand, arm numbness and tingling(ongoing for 3-4 weeks) Has had bone infection and multiple surgeries on that arm and hand and had compund fracture back in the 1990s and he wants to know if that could be related      ASSESSMENT AND PLAN  Lawrence Hines is a 57 y.o. male   History of left arm compounding fracture, required multiple surgery in the past, baseline mild limitation on supination Subacute onset left fourth and fifth finger paresthesia  EMG nerve conduction study for evaluation of possible left ulnar neuropathy,  Brisk reflex on examination, MRI of cervical spine to rule out cervical spondylitic myelopathy and left cervical radiculopathy  DIAGNOSTIC DATA (LABS, IMAGING, TESTING) - I reviewed patient records, labs, notes, testing and imaging myself where available.   MEDICAL HISTORY:  Lawrence Hines, is a 57 year old male seen in request by his primary care physician Dr. Sherril Croon, [place]/for evaluation of left hand paresthesia initial evaluation was on Feb 27, 2023  I reviewed and summarized the referring note. PMHX. HTN Left arm surgery 3 times.    He suffered a baseball injury of left arm with compounding fracture in 1988, required multiple surgery, at baseline, he has limited range of motion of left arm, mild difficulty with supervision, but denies sensory changes or weakness  woke up 1 day in April 2024, noticed numbness of left fourth and fifth fingers, also mild weakness, dropped things from his hands  He has intermittent neck pain, but denies significant shooting pain  Denies bowel bladder incontinence no gait abnormality  X-ray of cervical February 12, 2023, mild degenerative changes no acute abnormality  x-ray of left shoulder with AC joint degenerative changes, no acute osseous abnormalities.    PHYSICAL EXAM:   Vitals:   02/27/23 1509  BP: 131/81  Pulse: 100   Weight: 217 lb (98.4 kg)  Height: 5\' 10"  (1.778 m)   Not recorded     Body mass index is 31.14 kg/m.  PHYSICAL EXAMNIATION:  Gen: NAD, conversant, well nourised, well groomed                     Cardiovascular: Regular rate rhythm, no peripheral edema, warm, nontender. Eyes: Conjunctivae clear without exudates or hemorrhage Neck: Supple, no carotid bruits. Pulmonary: Clear to auscultation bilaterally   NEUROLOGICAL EXAM:  MENTAL STATUS: Speech/cognition: Awake, alert, oriented to history taking and casual conversation CRANIAL NERVES: CN II: Visual fields are full to confrontation. Pupils are round equal and briskly reactive to light. CN III, IV, VI: extraocular movement are normal. No ptosis. CN V: Facial sensation is intact to light touch CN VII: Face is symmetric with normal eye closure  CN VIII: Hearing is normal to causal conversation. CN IX, X: Phonation is normal. CN XI: Head turning and shoulder shrug are intact  MOTOR: Mild limitation of left arm upon supination, well-healed left forearm scar, mild finger abduction weakness   REFLEXES: Reflexes are 2+ and symmetric at the biceps, triceps, 3/3 knees, and ankles. Plantar responses are flexor.  SENSORY: Decreased light touch pinprick at the left fourth and fifth finger, bilateral elbow Tinel signs,  COORDINATION: There is no trunk or limb dysmetria noted.  GAIT/STANCE: Posture is normal. Gait is steady with normal steps, base, arm swing, and turning. Heel and toe walking are normal. Tandem gait is normal.  Romberg is absent.  REVIEW OF SYSTEMS:  Full 14 system review of systems performed and notable only for as above All other review of systems were negative.   ALLERGIES: No Known Allergies  HOME MEDICATIONS: Current Outpatient Medications  Medication Sig Dispense Refill   amLODipine (NORVASC) 5 MG tablet Take 5 mg by mouth daily.  0   clonazePAM (KLONOPIN) 0.5 MG tablet Take 0.5 mg by mouth at  bedtime. As needed.     hydrochlorothiazide (HYDRODIURIL) 12.5 MG tablet Take 12.5 mg by mouth daily.     Lidocaine 2 % GEL Apply 1 application topically as needed. Apply in anal area 28.33 g 1   olmesartan (BENICAR) 40 MG tablet Take 40 mg by mouth daily.     tadalafil (CIALIS) 5 MG tablet Take 5 mg by mouth daily as needed for erectile dysfunction.     No current facility-administered medications for this visit.    PAST MEDICAL HISTORY: Past Medical History:  Diagnosis Date   Headache    Hypertension    Neck pain     PAST SURGICAL HISTORY: Past Surgical History:  Procedure Laterality Date   FOREARM SURGERY Left    x 3   KNEE SURGERY Bilateral    Left x 2, Right x 1   VASECTOMY      FAMILY HISTORY: Family History  Problem Relation Age of Onset   Lung cancer Father    Hypertension Father    Hyperlipidemia Father    Diabetes Father    Healthy Mother     SOCIAL HISTORY: Social History   Socioeconomic History   Marital status: Married    Spouse name: Not on file   Number of children: 1   Years of education: 13   Highest education level: Not on file  Occupational History   Occupation: Child psychotherapist  Tobacco Use   Smoking status: Never   Smokeless tobacco: Current    Types: Snuff   Tobacco comments:    1 can of chew per week  Vaping Use   Vaping Use: Never used  Substance and Sexual Activity   Alcohol use: Yes    Alcohol/week: 3.0 standard drinks of alcohol    Types: 3 Standard drinks or equivalent per week    Comment: Social use only   Drug use: No   Sexual activity: Yes  Other Topics Concern   Not on file  Social History Narrative   Lives at home with wife and son.   Right-handed.   No caffeine use.   Social Determinants of Health   Financial Resource Strain: Not on file  Food Insecurity: Not on file  Transportation Needs: Not on file  Physical Activity: Not on file  Stress: Not on file  Social Connections: Not on file  Intimate Partner  Violence: Not on file      Levert Feinstein, M.D. Ph.D.  [facility] [address] [place], [place] [address] Ph: [phone] Fax: [phone]  CC:  Wendall Papa [address] [place],  [place] [address]  Ignatius Specking, MD

## 2023-03-04 ENCOUNTER — Ambulatory Visit (INDEPENDENT_AMBULATORY_CARE_PROVIDER_SITE_OTHER): Payer: BC Managed Care – PPO

## 2023-03-04 ENCOUNTER — Telehealth: Payer: Self-pay | Admitting: Neurology

## 2023-03-04 DIAGNOSIS — R29898 Other symptoms and signs involving the musculoskeletal system: Secondary | ICD-10-CM | POA: Diagnosis not present

## 2023-03-04 DIAGNOSIS — M5412 Radiculopathy, cervical region: Secondary | ICD-10-CM | POA: Insufficient documentation

## 2023-03-04 DIAGNOSIS — R292 Abnormal reflex: Secondary | ICD-10-CM | POA: Diagnosis not present

## 2023-03-04 NOTE — Telephone Encounter (Signed)
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

## 2023-03-05 ENCOUNTER — Telehealth: Payer: Self-pay | Admitting: Neurology

## 2023-03-05 NOTE — Telephone Encounter (Signed)
Referral sent to [facility]: Phone: [phone] Fax: [phone]

## 2023-06-18 ENCOUNTER — Ambulatory Visit: Payer: Self-pay | Admitting: Neurology

## 2023-06-18 ENCOUNTER — Ambulatory Visit (INDEPENDENT_AMBULATORY_CARE_PROVIDER_SITE_OTHER): Payer: BC Managed Care – PPO | Admitting: Neurology

## 2023-06-18 VITALS — BP 148/105 | HR 56 | Ht 70.0 in

## 2023-06-18 DIAGNOSIS — R29898 Other symptoms and signs involving the musculoskeletal system: Secondary | ICD-10-CM

## 2023-06-18 DIAGNOSIS — R292 Abnormal reflex: Secondary | ICD-10-CM

## 2023-06-18 DIAGNOSIS — M542 Cervicalgia: Secondary | ICD-10-CM

## 2023-06-18 DIAGNOSIS — Z0289 Encounter for other administrative examinations: Secondary | ICD-10-CM

## 2023-06-18 MED ORDER — GABAPENTIN 300 MG PO CAPS: 300.0000 mg | ORAL_CAPSULE | Freq: Three times a day (TID) | ORAL | 11 refills | Status: AC

## 2023-06-18 NOTE — Progress Notes (Signed)
ASSESSMENT AND PLAN  Lawrence Hines is a 57 y.o. male   History of left arm compounding fracture, required multiple surgery in the past, baseline mild limitation on supination Subacute onset left fourth and fifth finger paresthesia  EMG study along with MRI cervical findings, point towards left cervical radiculopathy, involving left C7, C8-T1,  Will refer him to neurosurgeon for potential surgical decompression,  Gabapentin 300 mg 3 times daily as needed  Celebrex 100 mg twice daily as needed  DIAGNOSTIC DATA (LABS, IMAGING, TESTING) - I reviewed patient records, labs, notes, testing and imaging myself where available.   MEDICAL HISTORY:  Lawrence Hines, is a 57 year old male seen in request by his primary care physician Dr. Sherril Croon, Dhruv/for evaluation of left hand paresthesia initial evaluation was on Feb 27, 2023  I reviewed and summarized the referring note. PMHX. HTN Left arm surgery 3 times.    He suffered a baseball injury of left arm with compounding fracture in 1988, required multiple surgery, at baseline, he has limited range of motion of left arm, mild difficulty with supervision, but denies sensory changes or weakness  He woke up 1 day in April 2024, noticed numbness of left fourth and fifth fingers, also mild weakness, dropped things from his hands  He has intermittent neck pain, but denies significant shooting pain  Denies bowel bladder incontinence no gait abnormality  X-ray of cervical February 12, 2023, mild degenerative changes no acute abnormality  x-ray of left shoulder with AC joint degenerative changes, no acute osseous abnormalities.  UPDATE June 18 2023: Continue to complains persistent left hand paresthesia, weakness, EMG nerve conduction study demonstrated chronic left C7, C8 T1, radiculopathy, there is also evidence of chronic left ulnar neuropathy, most likely related to his left forearm injury in the past  Personally reviewed MRI of the cervical  spine, from May 2024, multilevel degenerative changes, most prominent at C5-6, C6-7, with severe left foraminal narrowing at C6-7, possible encroachment on the left C7 nerve root  He complains of neck pain radiating pain to left shoulder upper extremity, could not find a comfortable position to sleep,  PHYSICAL EXAM: 148/105, HR 56   PHYSICAL EXAMNIATION:  Gen: NAD, conversant, well nourised, well groomed                     Cardiovascular: Regular rate rhythm, no peripheral edema, warm, nontender. Eyes: Conjunctivae clear without exudates or hemorrhage Neck: Supple, no carotid bruits. Pulmonary: Clear to auscultation bilaterally   NEUROLOGICAL EXAM:  MENTAL STATUS: Speech/cognition: Awake, alert, oriented to history taking and casual conversation CRANIAL NERVES: CN II: Visual fields are full to confrontation. Pupils are round equal and briskly reactive to light. CN III, IV, VI: extraocular movement are normal. No ptosis. CN V: Facial sensation is intact to light touch CN VII: Face is symmetric with normal eye closure  CN VIII: Hearing is normal to causal conversation. CN IX, X: Phonation is normal. CN XI: Head turning and shoulder shrug are intact  MOTOR: Mild limitation of left arm upon supination, well-healed left forearm scar, mild finger abduction weakness  REFLEXES: Reflexes are 2+ and symmetric at the biceps, triceps, 3/3 knees, and ankles. Plantar responses are flexor.  SENSORY: Decreased light touch pinprick at the left fourth and fifth finger, bilateral elbow Tinel signs,  COORDINATION: There is no trunk or limb dysmetria noted.  GAIT/STANCE: Posture is normal. Gait is steady with normal steps, base, arm swing, and turning. Heel and toe  walking are normal. Tandem gait is normal.  Romberg is absent.  REVIEW OF SYSTEMS:  Full 14 system review of systems performed and notable only for as above All other review of systems were negative.   ALLERGIES: No Known  Allergies  HOME MEDICATIONS: Current Outpatient Medications  Medication Sig Dispense Refill   amLODipine (NORVASC) 5 MG tablet Take 5 mg by mouth daily.  0   clonazePAM (KLONOPIN) 0.5 MG tablet Take 0.5 mg by mouth at bedtime. As needed.     hydrochlorothiazide (HYDRODIURIL) 12.5 MG tablet Take 12.5 mg by mouth daily.     Lidocaine 2 % GEL Apply 1 application topically as needed. Apply in anal area 28.33 g 1   olmesartan (BENICAR) 40 MG tablet Take 40 mg by mouth daily.     tadalafil (CIALIS) 5 MG tablet Take 5 mg by mouth daily as needed for erectile dysfunction.     No current facility-administered medications for this visit.    PAST MEDICAL HISTORY: Past Medical History:  Diagnosis Date   Headache    Hypertension    Neck pain     PAST SURGICAL HISTORY: Past Surgical History:  Procedure Laterality Date   FOREARM SURGERY Left    x 3   KNEE SURGERY Bilateral    Left x 2, Right x 1   VASECTOMY      FAMILY HISTORY: Family History  Problem Relation Age of Onset   Lung cancer Father    Hypertension Father    Hyperlipidemia Father    Diabetes Father    Healthy Mother     SOCIAL HISTORY: Social History   Socioeconomic History   Marital status: Married    Spouse name: Not on file   Number of children: 1   Years of education: 13   Highest education level: Not on file  Occupational History   Occupation: Child psychotherapist  Tobacco Use   Smoking status: Never   Smokeless tobacco: Current    Types: Snuff   Tobacco comments:    1 can of chew per week  Vaping Use   Vaping status: Never Used  Substance and Sexual Activity   Alcohol use: Yes    Alcohol/week: 3.0 standard drinks of alcohol    Types: 3 Standard drinks or equivalent per week    Comment: Social use only   Drug use: No   Sexual activity: Yes  Other Topics Concern   Not on file  Social History Narrative   Lives at home with wife and son.   Right-handed.   No caffeine use.   Social Determinants of  Health   Financial Resource Strain: Not on file  Food Insecurity: Not on file  Transportation Needs: Not on file  Physical Activity: Not on file  Stress: Not on file  Social Connections: Not on file  Intimate Partner Violence: Not on file      Levert Feinstein, M.D. Ph.D.  [facility] [address] [place], [place] [address] Ph: [phone] Fax: [phone]  CC:  Ignatius Specking, MD [address] [place],  [place] [address]  Ignatius Specking, MD

## 2023-06-27 ENCOUNTER — Telehealth: Payer: Self-pay | Admitting: Neurology

## 2023-06-27 MED ORDER — CELECOXIB 100 MG PO CAPS: 100.0000 mg | ORAL_CAPSULE | Freq: Two times a day (BID) | ORAL | 6 refills | Status: AC | PRN

## 2023-06-27 NOTE — Telephone Encounter (Signed)
I called patient, he was not contacted by neurosurgical clinic, neurosurgery referral was put in May 2024  He complains of persistent left neck pain, difficulty sleeping, despite taking gabapentin, and low-dose clonazepam as needed  Add on Celebrex 100 mg twice a day  Please make sure he will get a neurosurgical appointment soon,

## 2023-06-27 NOTE — Procedures (Signed)
Full Name: Lawrence Hines Gender: Male MRN #: [id number] Date of Birth: [birth year]    Visit Date: 06/18/2023 09:17 Age: 57 Years Examining Physician: Dr. Levert Feinstein Referring Physician: Dr. Levert Feinstein Height: 5 feet 10 inch History: 57 year old male, with history of left upper extremity compounding fracture required multiple surgery in the past, presenting with acute onset of numbness involving left fourth and fifth fingers, also have left hand weakness with intermittent neck pain,  Summary of the test: Nerve conduction study: Right median sensory response showed mildly prolonged peak latency within normal range snap amplitude.  Right ulnar sensory responses were normal.  Left median sensory responses showed mildly prolonged peak latency with normal snap amplitude.  Left ulnar sensory responses were absent, it was also a technically difficult study due to previous left arm surgery.  Left ulnar antidromic sensory response was present, with moderately decreased snap amplitude  Left ulnar dorsal cutaneous nerve was within normal limit.  Bilateral median, right ulnar motor responses were normal.  Left ulnar motor responses showed significantly decreased CMAP amplitude.  Left ulnar F-wave latency was 4.8 ms prolonged in comparison to right ulnar F-wave latency.  Electromyography:  Selected needle examinations were performed at bilateral upper extremity muscles and cervical paraspinal muscles.  There is evidence of active neuropathic changes involving left C8, T1, C7 myotomes, lesser degree of chronic neuropathic changes involving left C6, 7 myotomes.  There was no spontaneous activity at left cervical paraspinals.  Conclusion: This is an abnormal study.  There is evidence of left cervical radiculopathy, mainly involving left C7, 8, T1 myotomes.  The abnormal left ulnar sensory and motor response also indicating a mild chronic left ulnar neuropathy, which are are most likely  related to his previous left forearm injury.    ------------------------------- Levert Feinstein M.D. Ph.D.  [facility] [address], [address] [place], [place] [address] Tel: [phone] Fax: [phone]  Verbal informed consent was obtained from the patient, patient was informed of potential risk of procedure, including bruising, bleeding, hematoma formation, infection, muscle weakness, muscle pain, numbness, among others.        MNC    Nerve / Sites Muscle Latency Ref. Amplitude Ref. Rel Amp Segments Distance Velocity Ref. Area    ms ms mV mV %  cm m/s m/s mVms  L Median - APB     Wrist APB 4.1 ?4.4 5.3 ?4.0 100 Wrist - APB 7   19.7     Upper arm APB 8.5  4.8  90.9 Upper arm - Wrist 24 54 ?49 18.5  R Median - APB     Wrist APB 4.1 ?4.4 8.0 ?4.0 100 Wrist - APB 7   28.7     Upper arm APB 8.8  8.0  99.2 Upper arm - Wrist 24 51 ?49 28.3  L Ulnar - ADM     Wrist ADM 5.9 ?3.3 2.6 ?6.0 100 Wrist - ADM 7   10.5     B.Elbow ADM 8.5  3.7  145 B.Elbow - Wrist 14 54 ?49 13.1     A.Elbow ADM 11.5  2.9  77.3 A.Elbow - B.Elbow 13 42 ?49 12.6  R Ulnar - ADM     Wrist ADM 3.2 ?3.3 6.0 ?6.0 100 Wrist - ADM 7   18.1     B.Elbow ADM 5.2  5.6  92.8 B.Elbow - Wrist 11 56 ?49 17.8     A.Elbow ADM 8.3  5.5  98.4 A.Elbow - B.Elbow  16 51 ?49 16.3             SNC    Nerve / Sites Rec. Site Peak Lat Ref.  Amp Ref. Segments Distance    ms ms V V  cm  L Median - Digit III (Antidromic)     Wrist Dig III 3.6 ?3.6 16 ?15 Wrist - Dig III 14  L Ulnar - Digit V (Antidromic)     Wrist Dig V 3.5 ?3.1 4 ?17 Wrist - Dig V 11  L Median - Orthodromic (Dig II, Mid palm)     Dig II Wrist 3.5 ?3.4 20 ?10 Dig II - Wrist 13  R Median - Orthodromic (Dig II, Mid palm)     Dig II Wrist 3.5 ?3.4 16 ?10 Dig II - Wrist 13  L Ulnar - Orthodromic, (Dig V, Mid palm)     Dig V Wrist NR  15 ?5 Dig V - Wrist 11  R Ulnar - Orthodromic, (Dig V, Mid palm)     Dig V Wrist 3.1 ?3.1 7 ?5 Dig V - Wrist 11  L  Dorsal ulnar cutaneous - Hand dorsum (Forearm)     Forearm Hand dorsum 1.8 ?2.5 13 ?8 Forearm - Hand dorsum 8                   F  Wave    Nerve F Lat Ref.   ms ms  L Ulnar - ADM 35.3 ?32.0  R Ulnar - ADM 30.5 ?32.0         EMG Summary Table    Spontaneous MUAP Recruitment  Muscle IA Fib PSW Fasc Other Amp Dur. Poly Pattern  L. First dorsal interosseous Increased 1+ None None _______ Increased Increased 1+ Reduced  L. Abductor pollicis brevis Increased None None None _______ Increased Increased 1+ Reduced  L. Abductor digiti minimi (manus) Increased 1+ None None _______ Increased Increased 1+ Reduced  L. Pronator teres Normal None None None _______ Normal Normal Normal Normal  L. Brachioradialis Normal None None None _______ Normal Normal Normal Reduced  L. Extensor digitorum communis Normal None None None _______ Increased Increased 1+ Reduced  L. Biceps brachii Normal None None None _______ Normal Normal Normal Normal  L. Deltoid Normal None None None _______ Normal Normal Normal Normal  L. Triceps brachii Normal None None None _______ Normal Normal Normal Reduced  L. Cervical paraspinals Increased None None None _______ Normal Normal Normal Normal  R. First dorsal interosseous Normal None None None _______ Normal Normal Normal Normal  R. Abductor pollicis brevis Normal None None None _______ Normal Normal Normal Normal  R. Pronator teres Normal None None None _______ Normal Normal Normal Normal  R. Triceps brachii Normal None None None _______ Normal Normal Normal Normal  R. Deltoid Normal None None None _______ Normal Normal Normal Normal  R. Biceps brachii Normal None None None _______ Normal Normal Normal Normal

## 2024-10-12 ENCOUNTER — Other Ambulatory Visit (HOSPITAL_COMMUNITY): Payer: Self-pay

## 2024-12-03 ENCOUNTER — Telehealth: Payer: Self-pay | Admitting: *Deleted

## 2024-12-03 NOTE — Telephone Encounter (Signed)
 Who is your primary care physician: Dr.Dhruv Vyas  Reasons for the colonoscopy: colonoscopy, Hx polyps  Have you had a colonoscopy before?  Yes, 2020, Dr.Cathey  Do you have family history of colon cancer? no  Previous colonoscopy with polyps removed? yes  Do you have a history colorectal cancer?   no  Are you diabetic? If yes, Type 1 or Type 2?    no  Do you have a prosthetic or mechanical heart valve? no  Do you have a pacemaker/defibrillator?   no  Have you had endocarditis/atrial fibrillation? no  Have you had joint replacement within the last 12 months?  no  Do you tend to be constipated or have to use laxatives? bo  Do you have any history of drugs or alcohol?  no  Do you use supplemental oxygen?  no  Have you had a stroke or heart attack within the last 6 months? no  Do you take weight loss medication?  no   Do you take any blood-thinning medications such as: (aspirin, warfarin, Plavix, Aggrenox)  no  If yes we need the name, milligram, dosage and who is prescribing doctor n/a  Current Outpatient Medications  Medication Sig Dispense Refill   amLODipine (NORVASC) 5 MG tablet Take 5 mg by mouth daily.  0   celecoxib  (CELEBREX ) 100 MG capsule Take 1 capsule (100 mg total) by mouth 2 (two) times daily as needed. 60 capsule 6   clonazePAM (KLONOPIN) 0.5 MG tablet Take 0.5 mg by mouth at bedtime. As needed.     gabapentin  (NEURONTIN ) 300 MG capsule Take 1 capsule (300 mg total) by mouth 3 (three) times daily. 90 capsule 11   hydrochlorothiazide (HYDRODIURIL) 12.5 MG tablet Take 12.5 mg by mouth daily.     Lidocaine  2 % GEL Apply 1 application topically as needed. Apply in anal area 28.33 g 1   olmesartan (BENICAR) 40 MG tablet Take 40 mg by mouth daily.     tadalafil (CIALIS) 5 MG tablet Take 5 mg by mouth daily as needed for erectile dysfunction.     No current facility-administered medications for this visit.    Allergies[1]  Pharmacy: [facility]  Primary  Insurance Name: [employer] Best number where you can be reached:  [phone]     [1] No Known Allergies
